# Patient Record
Sex: Female | Born: 1970 | Race: White | Hispanic: No | Marital: Married | Smoking: Never smoker
Health system: Southern US, Community
[De-identification: ages and names within clinical notes are randomized; demographics above are authoritative.]

## PROBLEM LIST (undated history)

## (undated) DIAGNOSIS — K5792 Diverticulitis of intestine, part unspecified, without perforation or abscess without bleeding: Secondary | ICD-10-CM

---

## 1999-09-24 ENCOUNTER — Other Ambulatory Visit: Admission: RE | Admit: 1999-09-24 | Discharge: 1999-09-24 | Payer: Self-pay | Admitting: Obstetrics and Gynecology

## 2000-07-31 ENCOUNTER — Encounter: Admission: RE | Admit: 2000-07-31 | Discharge: 2000-10-29 | Payer: Self-pay | Admitting: Internal Medicine

## 2000-10-21 ENCOUNTER — Other Ambulatory Visit: Admission: RE | Admit: 2000-10-21 | Discharge: 2000-10-21 | Payer: Self-pay | Admitting: Obstetrics and Gynecology

## 2003-02-07 ENCOUNTER — Other Ambulatory Visit: Admission: RE | Admit: 2003-02-07 | Discharge: 2003-02-07 | Payer: Self-pay | Admitting: Obstetrics and Gynecology

## 2004-02-08 ENCOUNTER — Other Ambulatory Visit: Admission: RE | Admit: 2004-02-08 | Discharge: 2004-02-08 | Payer: Self-pay | Admitting: Obstetrics and Gynecology

## 2005-03-05 ENCOUNTER — Other Ambulatory Visit: Admission: RE | Admit: 2005-03-05 | Discharge: 2005-03-05 | Payer: Self-pay | Admitting: Obstetrics and Gynecology

## 2010-05-09 ENCOUNTER — Other Ambulatory Visit: Payer: Self-pay | Admitting: Obstetrics and Gynecology

## 2010-05-09 DIAGNOSIS — Z1239 Encounter for other screening for malignant neoplasm of breast: Secondary | ICD-10-CM

## 2010-05-20 ENCOUNTER — Ambulatory Visit
Admission: RE | Admit: 2010-05-20 | Discharge: 2010-05-20 | Disposition: A | Discharge status: Home / Self Care | Payer: BC Managed Care – PPO | Source: Ambulatory Visit | Attending: Obstetrics and Gynecology | Admitting: Obstetrics and Gynecology

## 2010-05-20 DIAGNOSIS — Z1239 Encounter for other screening for malignant neoplasm of breast: Secondary | ICD-10-CM

## 2011-04-30 ENCOUNTER — Other Ambulatory Visit: Payer: Self-pay | Admitting: Obstetrics and Gynecology

## 2011-04-30 DIAGNOSIS — Z1231 Encounter for screening mammogram for malignant neoplasm of breast: Secondary | ICD-10-CM

## 2011-05-22 ENCOUNTER — Ambulatory Visit
Admission: RE | Admit: 2011-05-22 | Discharge: 2011-05-22 | Disposition: A | Discharge status: Home / Self Care | Payer: BC Managed Care – PPO | Source: Ambulatory Visit | Attending: Obstetrics and Gynecology | Admitting: Obstetrics and Gynecology

## 2011-05-22 DIAGNOSIS — Z1231 Encounter for screening mammogram for malignant neoplasm of breast: Secondary | ICD-10-CM

## 2012-05-17 ENCOUNTER — Other Ambulatory Visit: Payer: Self-pay | Admitting: Obstetrics and Gynecology

## 2012-05-17 DIAGNOSIS — Z1231 Encounter for screening mammogram for malignant neoplasm of breast: Secondary | ICD-10-CM

## 2012-06-07 ENCOUNTER — Ambulatory Visit
Admission: RE | Admit: 2012-06-07 | Discharge: 2012-06-07 | Disposition: A | Discharge status: Home / Self Care | Payer: No Typology Code available for payment source | Source: Ambulatory Visit | Attending: Obstetrics and Gynecology | Admitting: Obstetrics and Gynecology

## 2012-06-07 DIAGNOSIS — Z1231 Encounter for screening mammogram for malignant neoplasm of breast: Secondary | ICD-10-CM

## 2013-05-17 ENCOUNTER — Other Ambulatory Visit: Payer: Self-pay

## 2013-05-17 DIAGNOSIS — Z1231 Encounter for screening mammogram for malignant neoplasm of breast: Secondary | ICD-10-CM

## 2013-06-08 ENCOUNTER — Ambulatory Visit
Admission: RE | Admit: 2013-06-08 | Discharge: 2013-06-08 | Disposition: A | Discharge status: Home / Self Care | Payer: BC Managed Care – PPO | Source: Ambulatory Visit

## 2013-06-08 DIAGNOSIS — Z1231 Encounter for screening mammogram for malignant neoplasm of breast: Secondary | ICD-10-CM

## 2014-05-22 ENCOUNTER — Other Ambulatory Visit: Payer: Self-pay

## 2014-05-22 DIAGNOSIS — Z1231 Encounter for screening mammogram for malignant neoplasm of breast: Secondary | ICD-10-CM

## 2014-06-12 ENCOUNTER — Ambulatory Visit
Admission: RE | Admit: 2014-06-12 | Discharge: 2014-06-12 | Disposition: A | Discharge status: Home / Self Care | Payer: 59 | Source: Ambulatory Visit

## 2014-06-12 ENCOUNTER — Encounter (INDEPENDENT_AMBULATORY_CARE_PROVIDER_SITE_OTHER): Payer: Self-pay

## 2014-06-12 DIAGNOSIS — Z1231 Encounter for screening mammogram for malignant neoplasm of breast: Secondary | ICD-10-CM

## 2014-10-11 ENCOUNTER — Encounter (HOSPITAL_COMMUNITY): Payer: Self-pay | Admitting: Emergency Medicine

## 2014-10-11 ENCOUNTER — Emergency Department (HOSPITAL_COMMUNITY)
Admission: EM | Admit: 2014-10-11 | Discharge: 2014-10-12 | Disposition: A | Discharge status: Home / Self Care | Payer: 59 | Attending: Emergency Medicine | Admitting: Emergency Medicine

## 2014-10-11 DIAGNOSIS — K5732 Diverticulitis of large intestine without perforation or abscess without bleeding: Secondary | ICD-10-CM

## 2014-10-11 DIAGNOSIS — Z792 Long term (current) use of antibiotics: Secondary | ICD-10-CM | POA: Diagnosis not present

## 2014-10-11 DIAGNOSIS — Z3202 Encounter for pregnancy test, result negative: Secondary | ICD-10-CM | POA: Insufficient documentation

## 2014-10-11 DIAGNOSIS — Z79899 Other long term (current) drug therapy: Secondary | ICD-10-CM | POA: Diagnosis not present

## 2014-10-11 DIAGNOSIS — R1032 Left lower quadrant pain: Secondary | ICD-10-CM | POA: Diagnosis present

## 2014-10-11 HISTORY — DX: Diverticulitis of intestine, part unspecified, without perforation or abscess without bleeding: K57.92

## 2014-10-11 LAB — URINALYSIS, ROUTINE W REFLEX MICROSCOPIC
Bilirubin Urine: NEGATIVE
Glucose, UA: NEGATIVE mg/dL
KETONES UR: NEGATIVE mg/dL
LEUKOCYTES UA: NEGATIVE
NITRITE: NEGATIVE
PROTEIN: NEGATIVE mg/dL
SPECIFIC GRAVITY, URINE: 1.002 — AB (ref 1.005–1.030)
UROBILINOGEN UA: 0.2 mg/dL (ref 0.0–1.0)
pH: 5.5 (ref 5.0–8.0)

## 2014-10-11 LAB — CBC
HCT: 42.3 % (ref 36.0–46.0)
HEMOGLOBIN: 14 g/dL (ref 12.0–15.0)
MCH: 29.1 pg (ref 26.0–34.0)
MCHC: 33.1 g/dL (ref 30.0–36.0)
MCV: 87.9 fL (ref 78.0–100.0)
Platelets: 272 10*3/uL (ref 150–400)
RBC: 4.81 MIL/uL (ref 3.87–5.11)
RDW: 13.3 % (ref 11.5–15.5)
WBC: 15.9 10*3/uL — ABNORMAL HIGH (ref 4.0–10.5)

## 2014-10-11 LAB — URINE MICROSCOPIC-ADD ON

## 2014-10-11 LAB — COMPREHENSIVE METABOLIC PANEL
ALK PHOS: 84 U/L (ref 38–126)
ALT: 33 U/L (ref 14–54)
AST: 33 U/L (ref 15–41)
Albumin: 3.9 g/dL (ref 3.5–5.0)
Anion gap: 10 (ref 5–15)
BILIRUBIN TOTAL: 0.9 mg/dL (ref 0.3–1.2)
BUN: <5 mg/dL — ABNORMAL LOW (ref 6–20)
CALCIUM: 9.8 mg/dL (ref 8.9–10.3)
CHLORIDE: 102 mmol/L (ref 101–111)
CO2: 25 mmol/L (ref 22–32)
CREATININE: 0.82 mg/dL (ref 0.44–1.00)
GFR calc non Af Amer: >60 mL/min (ref >60)
Glucose, Bld: 144 mg/dL — ABNORMAL HIGH (ref 65–99)
Potassium: 3.3 mmol/L — ABNORMAL LOW (ref 3.5–5.1)
SODIUM: 137 mmol/L (ref 135–145)
TOTAL PROTEIN: 7.7 g/dL (ref 6.5–8.1)

## 2014-10-11 LAB — LIPASE, BLOOD: LIPASE: 25 U/L (ref 22–51)

## 2014-10-11 LAB — I-STAT CG4 LACTIC ACID, ED: Lactic Acid, Venous: 2.83 mmol/L (ref 0.5–2.0)

## 2014-10-11 MED ORDER — IOHEXOL 300 MG/ML  SOLN
25.0000 mL | INTRAMUSCULAR | Status: AC
  Administered 2014-10-11: 25 mL via ORAL

## 2014-10-11 MED ORDER — SODIUM CHLORIDE 0.9 % IV BOLUS (SEPSIS)
1000.0000 mL | Freq: Once | INTRAVENOUS | Status: AC
  Administered 2014-10-11: 1000 mL via INTRAVENOUS

## 2014-10-11 NOTE — ED Notes (Addendum)
Seen at PCP office today and diagnosed with diverticulitis.  Reports LLQ pain, fever (Tmax 102.6), and nausea since yesterday at 5pm.  Denies nausea at present.  States she was given Tylenol at dr's office and started on antibiotic Rx that she was given.  After getting home she received a call that WBC was elevated and to go to ED.

## 2014-10-12 ENCOUNTER — Emergency Department (HOSPITAL_COMMUNITY): Payer: 59

## 2014-10-12 ENCOUNTER — Encounter (HOSPITAL_COMMUNITY): Payer: Self-pay | Admitting: Emergency Medicine

## 2014-10-12 LAB — POC URINE PREG, ED: Preg Test, Ur: NEGATIVE

## 2014-10-12 MED ORDER — FLUCONAZOLE 200 MG PO TABS: 200.0000 mg | ORAL_TABLET | Freq: Once | ORAL | Status: AC

## 2014-10-12 MED ORDER — IOHEXOL 300 MG/ML  SOLN
80.0000 mL | Freq: Once | INTRAMUSCULAR | Status: AC | PRN
  Administered 2014-10-12: 80 mL via INTRAVENOUS

## 2014-10-12 MED ORDER — OXYCODONE-ACETAMINOPHEN 5-325 MG PO TABS: 1.0000 | ORAL_TABLET | Freq: Four times a day (QID) | ORAL | Status: AC | PRN

## 2014-10-12 NOTE — Discharge Instructions (Signed)
Your CT scan shows uncomplicated diverticulitis. Continue antibiotics as prescribed by her primary physician. You will also be given pain medication.  If you develop fevers, worsening pain or any new or worsening symptoms she should be reevaluated.  Diverticulitis Diverticulitis is inflammation or infection of small pouches in your colon that form when you have a condition called diverticulosis. The pouches in your colon are called diverticula. Your colon, or large intestine, is where water is absorbed and stool is formed. Complications of diverticulitis can include:  Bleeding.  Severe infection.  Severe pain.  Perforation of your colon.  Obstruction of your colon. CAUSES  Diverticulitis is caused by bacteria. Diverticulitis happens when stool becomes trapped in diverticula. This allows bacteria to grow in the diverticula, which can lead to inflammation and infection. RISK FACTORS People with diverticulosis are at risk for diverticulitis. Eating a diet that does not include enough fiber from fruits and vegetables may make diverticulitis more likely to develop. SYMPTOMS  Symptoms of diverticulitis may include:  Abdominal pain and tenderness. The pain is normally located on the left side of the abdomen, but may occur in other areas.  Fever and chills.  Bloating.  Cramping.  Nausea.  Vomiting.  Constipation.  Diarrhea.  Blood in your stool. DIAGNOSIS  Your health care provider will ask you about your medical history and do a physical exam. You may need to have tests done because many medical conditions can cause the same symptoms as diverticulitis. Tests may include:  Blood tests.  Urine tests.  Imaging tests of the abdomen, including X-rays and CT scans. When your condition is under control, your health care provider may recommend that you have a colonoscopy. A colonoscopy can show how severe your diverticula are and whether something else is causing your  symptoms. TREATMENT  Most cases of diverticulitis are mild and can be treated at home. Treatment may include:  Taking over-the-counter pain medicines.  Following a clear liquid diet.  Taking antibiotic medicines by mouth for 7-10 days. More severe cases may be treated at a hospital. Treatment may include:  Not eating or drinking.  Taking prescription pain medicine.  Receiving antibiotic medicines through an IV tube.  Receiving fluids and nutrition through an IV tube.  Surgery. HOME CARE INSTRUCTIONS   Follow your health care provider's instructions carefully.  Follow a full liquid diet or other diet as directed by your health care provider. After your symptoms improve, your health care provider may tell you to change your diet. He or she may recommend you eat a high-fiber diet. Fruits and vegetables are good sources of fiber. Fiber makes it easier to pass stool.  Take fiber supplements or probiotics as directed by your health care provider.  Only take medicines as directed by your health care provider.  Keep all your follow-up appointments. SEEK MEDICAL CARE IF:   Your pain does not improve.  You have a hard time eating food.  Your bowel movements do not return to normal. SEEK IMMEDIATE MEDICAL CARE IF:   Your pain becomes worse.  Your symptoms do not get better.  Your symptoms suddenly get worse.  You have a fever.  You have repeated vomiting.  You have bloody or black, tarry stools. MAKE SURE YOU:   Understand these instructions.  Will watch your condition.  Will get help right away if you are not doing well or get worse. Document Released: 12/25/2004 Document Revised: 03/22/2013 Document Reviewed: 02/09/2013 [facility] Patient Information 2015 [employer] [place]. This information is not  intended to replace advice given to you by your health care provider. Make sure you discuss any questions you have with your health care provider. ° °

## 2014-10-12 NOTE — ED Notes (Signed)
Patient transported to CT 

## 2014-10-12 NOTE — ED Notes (Signed)
Pt returned from CT °

## 2014-10-12 NOTE — ED Provider Notes (Signed)
CSN: [id number]     Arrival date & time 10/11/14  1831 History   First MD Initiated Contact with Patient 10/11/14 2219     Chief Complaint  Patient presents with  . Abdominal Pain     (Consider location/radiation/quality/duration/timing/severity/associated sxs/prior Treatment) HPI Patient presents to the emergency department with left-sided lower abdominal pain that started today.  The patient states she was seen at her primary care doctor's office and he diagnosed her with diverticulitis, but then called her this evening and told her that her white blood cell count was elevated and that she needed to come to the emergency department immediately.  Patient states she took one dose of the anabiotic's before coming to the emergency department.  She states she did have fever.  Patient denies chest pain, shortness of breath, diarrhea, vomiting, weakness, dizziness, headache, blurred vision, back pain, dysuria, hematuria, bloody stool or syncope.  Patient states that palpation makes the pain worse.  She states nothing seems make her condition better Past Medical History  Diagnosis Date  . Diverticulitis    History reviewed. No pertinent past surgical history. No family history on file. History  Substance Use Topics  . Smoking status: Never Smoker   . Smokeless tobacco: Not on file  . Alcohol Use: No   OB History    No data available     Review of Systems  All other systems negative except as documented in the HPI. All pertinent positives and negatives as reviewed in the HPI.  Allergies  Hydrocodone and Sulfa antibiotics  Home Medications   Prior to Admission medications   Medication Sig Start Date End Date Taking? Authorizing Provider  ciprofloxacin (CIPRO) 500 MG tablet Take 500 mg by mouth 2 (two) times daily. Started on 10-11-14   Yes Historical Provider, MD  IRON PO Take 1 tablet by mouth daily.   Yes Historical Provider, MD  levothyroxine (LEVOXYL) 50 MCG tablet Take 50 mcg by  mouth daily before breakfast.   Yes Historical Provider, MD  metroNIDAZOLE (FLAGYL) 500 MG tablet Take 500 mg by mouth 2 (two) times daily. Started on 10-11-14   Yes Historical Provider, MD   BP 116/76 mmHg  Pulse 110  Temp(Src) 99.6 F (37.6 C) (Oral)  Resp 16  Ht 5\' 2"  (1.575 m)  Wt 116 lb (52.617 kg)  BMI 21.21 kg/m2  SpO2 100%  LMP 10/05/2014 Physical Exam  Constitutional: She is oriented to person, place, and time. She appears well-developed and well-nourished. No distress.  HENT:  Head: Normocephalic and atraumatic.  Mouth/Throat: Oropharynx is clear and moist.  Eyes: Pupils are equal, round, and reactive to light.  Neck: Normal range of motion. Neck supple.  Cardiovascular: Normal rate, regular rhythm and normal heart sounds.  Exam reveals no gallop and no friction rub.   No murmur heard. Pulmonary/Chest: Effort normal and breath sounds normal.  Abdominal: Soft. Normal appearance and bowel sounds are normal. She exhibits no distension. There is tenderness. There is no rebound and no guarding.    Neurological: She is alert and oriented to person, place, and time. Coordination normal.  Skin: Skin is warm and dry. No rash noted. No erythema.  Psychiatric: She has a normal mood and affect. Her behavior is normal.  Nursing note and vitals reviewed.   ED Course  Procedures (including critical care time) Labs Review Labs Reviewed  COMPREHENSIVE METABOLIC PANEL - Abnormal; Notable for the following:    Potassium 3.3 (*)    Glucose, Bld 144 (*)  BUN <5 (*)    All other components within normal limits  CBC - Abnormal; Notable for the following:    WBC 15.9 (*)    All other components within normal limits  URINALYSIS, ROUTINE W REFLEX MICROSCOPIC (NOT AT [facility]) - Abnormal; Notable for the following:    Specific Gravity, Urine 1.002 (*)    Hgb urine dipstick TRACE (*)    All other components within normal limits  I-STAT CG4 LACTIC ACID, ED - Abnormal; Notable for the  following:    Lactic Acid, Venous 2.83 (*)    All other components within normal limits  LIPASE, BLOOD  URINE MICROSCOPIC-ADD ON  PREGNANCY, URINE  I-STAT CG4 LACTIC ACID, ED    Imaging Review No results found.   The patient is awaiting CT scan.  She most likely does have some infectious process, infectious of fever and abdominal pain  Charlestine Night, PA-C 10/12/14 0044  Gilda Crease, MD 10/12/14 [phone]

## 2014-10-12 NOTE — ED Provider Notes (Signed)
Patient signed out pending CT scan. Being treated for diverticulitis by primary physician. Found to have an increased white blood cell count. CT scan obtained to rule out abscess or perforation. CT scan shows uncomplicated diverticulitis. Patient are ready on appropriate antibiotics. Will discharge with pain medication and continuation of antibiotics as an outpatient. Patient requesting Diflucan to prevent yeast infection.  Results for orders placed or performed during the hospital encounter of 10/11/14  Lipase, blood  Result Value Ref Range   Lipase 25 22 - 51 U/L  Comprehensive metabolic panel  Result Value Ref Range   Sodium 137 135 - 145 mmol/L   Potassium 3.3 (L) 3.5 - 5.1 mmol/L   Chloride 102 101 - 111 mmol/L   CO2 25 22 - 32 mmol/L   Glucose, Bld 144 (H) 65 - 99 mg/dL   BUN <5 (L) 6 - 20 mg/dL   Creatinine, Ser 9.810.82 0.44 - 1.00 mg/dL   Calcium 9.8 8.9 - 19.110.3 mg/dL   Total Protein 7.7 6.5 - 8.1 g/dL   Albumin 3.9 3.5 - 5.0 g/dL   AST 33 15 - 41 U/L   ALT 33 14 - 54 U/L   Alkaline Phosphatase 84 38 - 126 U/L   Total Bilirubin 0.9 0.3 - 1.2 mg/dL   GFR calc non Af Amer >60 >60 mL/min   GFR calc Af Amer >60 >60 mL/min   Anion gap 10 5 - 15  CBC  Result Value Ref Range   WBC 15.9 (H) 4.0 - 10.5 K/uL   RBC 4.81 3.87 - 5.11 MIL/uL   Hemoglobin 14.0 12.0 - 15.0 g/dL   HCT 47.842.3 29.536.0 - 62.146.0 %   MCV 87.9 78.0 - 100.0 fL   MCH 29.1 26.0 - 34.0 pg   MCHC 33.1 30.0 - 36.0 g/dL   RDW 30.813.3 65.711.5 - 84.615.5 %   Platelets 272 150 - 400 K/uL  Urinalysis, Routine w reflex microscopic ([facility])  Result Value Ref Range   Color, Urine YELLOW YELLOW   APPearance CLEAR CLEAR   Specific Gravity, Urine 1.002 (L) 1.005 - 1.030   pH 5.5 5.0 - 8.0   Glucose, UA NEGATIVE NEGATIVE mg/dL   Hgb urine dipstick TRACE (A) NEGATIVE   Bilirubin Urine NEGATIVE NEGATIVE   Ketones, ur NEGATIVE NEGATIVE mg/dL   Protein, ur NEGATIVE NEGATIVE mg/dL   Urobilinogen, UA 0.2 0.0 - 1.0 mg/dL   Nitrite NEGATIVE  NEGATIVE   Leukocytes, UA NEGATIVE NEGATIVE  Urine microscopic-add on  Result Value Ref Range   Squamous Epithelial / LPF RARE RARE   WBC, UA 0-2 <3 WBC/hpf   RBC / HPF 0-2 <3 RBC/hpf   Bacteria, UA RARE RARE  I-Stat CG4 Lactic Acid, ED  (not [facility])  Result Value Ref Range   Lactic Acid, Venous 2.83 (HH) 0.5 - 2.0 mmol/L   Comment NOTIFIED PHYSICIAN   POC Urine Pregnancy, ED ([facility] NOT [facility])  Result Value Ref Range   Preg Test, Ur NEGATIVE NEGATIVE   Ct Abdomen Pelvis W Contrast  10/12/2014   CLINICAL DATA:  44 year old female with left lower quadrant abdominal pain  EXAM: CT ABDOMEN AND PELVIS WITH CONTRAST  TECHNIQUE: Multidetector CT imaging of the abdomen and pelvis was performed using the standard protocol following bolus administration of intravenous contrast.  CONTRAST:  80mL OMNIPAQUE IOHEXOL 300 MG/ML  SOLN  COMPARISON:  None.  FINDINGS: The visualized lung bases are clear. No intra-abdominal free air. Trace free fluid within the pelvis.  The liver, gallbladder, pancreas, spleen, adrenal glands, kidneys, visualized ureters and urinary bladder appear unremarkable. The uterus is retroverted appears grossly unremarkable.  There is sigmoid diverticulosis. There is segmental thickening and inflammatory changes of the sigmoid compatible with diverticulitis. A 1.2 x 1.2 cm air containing structure along the wall of the sigmoid likely represents an inflamed diverticulum and less likely focally contained microperforation. No drainable fluid collection/ abscess identified. Follow-up after medical treatment recommended to exclude an underlying sigmoid mass. There is no evidence of bowel obstruction. The appendix is unremarkable. He the visualized abdominal aorta and IVC are patent. No portal venous gas identified. Small scattered retroperitoneal lymph nodes noted.  The osseous structures are unremarkable.  IMPRESSION: Sigmoid diverticulitis.  No abscess or evidence of perforation.    Electronically Signed   By: Elgie Collard M.D.   On: 10/12/2014 02:13      Shon Baton, MD 10/12/14 [phone]

## 2014-10-12 NOTE — ED Notes (Signed)
Pt verbalizes understanding of d/c instructions and denies any further needs at this time. 

## 2014-10-12 NOTE — ED Notes (Signed)
POC preg test is negative per [facility] in ED lab

## 2015-05-16 ENCOUNTER — Other Ambulatory Visit: Payer: Self-pay

## 2015-05-16 DIAGNOSIS — Z1231 Encounter for screening mammogram for malignant neoplasm of breast: Secondary | ICD-10-CM

## 2015-06-15 ENCOUNTER — Ambulatory Visit: Payer: 59

## 2015-06-15 ENCOUNTER — Ambulatory Visit
Admission: RE | Admit: 2015-06-15 | Discharge: 2015-06-15 | Disposition: A | Discharge status: Home / Self Care | Payer: Commercial Managed Care - HMO | Source: Ambulatory Visit

## 2015-06-15 DIAGNOSIS — Z1231 Encounter for screening mammogram for malignant neoplasm of breast: Secondary | ICD-10-CM

## 2016-06-03 ENCOUNTER — Other Ambulatory Visit: Payer: Self-pay | Admitting: Obstetrics and Gynecology

## 2016-06-03 DIAGNOSIS — Z1231 Encounter for screening mammogram for malignant neoplasm of breast: Secondary | ICD-10-CM

## 2016-06-25 ENCOUNTER — Ambulatory Visit
Admission: RE | Admit: 2016-06-25 | Discharge: 2016-06-25 | Disposition: A | Discharge status: Home / Self Care | Payer: PRIVATE HEALTH INSURANCE | Source: Ambulatory Visit | Attending: Obstetrics and Gynecology | Admitting: Obstetrics and Gynecology

## 2016-06-25 DIAGNOSIS — Z1231 Encounter for screening mammogram for malignant neoplasm of breast: Secondary | ICD-10-CM

## 2016-08-08 ENCOUNTER — Ambulatory Visit
Admission: RE | Admit: 2016-08-08 | Discharge: 2016-08-08 | Disposition: A | Discharge status: Home / Self Care | Payer: PRIVATE HEALTH INSURANCE | Source: Ambulatory Visit | Attending: Family Medicine | Admitting: Family Medicine

## 2016-08-08 ENCOUNTER — Other Ambulatory Visit: Payer: Self-pay | Admitting: Family Medicine

## 2016-08-08 DIAGNOSIS — K5792 Diverticulitis of intestine, part unspecified, without perforation or abscess without bleeding: Secondary | ICD-10-CM

## 2016-08-08 MED ORDER — IOPAMIDOL (ISOVUE-300) INJECTION 61%
100.0000 mL | Freq: Once | INTRAVENOUS | Status: AC | PRN
  Administered 2016-08-08: 100 mL via INTRAVENOUS

## 2017-05-18 ENCOUNTER — Other Ambulatory Visit: Payer: Self-pay | Admitting: Obstetrics and Gynecology

## 2017-05-18 DIAGNOSIS — Z139 Encounter for screening, unspecified: Secondary | ICD-10-CM

## 2017-06-26 ENCOUNTER — Ambulatory Visit
Admission: RE | Admit: 2017-06-26 | Discharge: 2017-06-26 | Disposition: A | Discharge status: Home / Self Care | Payer: PRIVATE HEALTH INSURANCE | Source: Ambulatory Visit | Attending: Obstetrics and Gynecology | Admitting: Obstetrics and Gynecology

## 2017-06-26 DIAGNOSIS — Z139 Encounter for screening, unspecified: Secondary | ICD-10-CM

## 2018-06-02 ENCOUNTER — Other Ambulatory Visit: Payer: Self-pay | Admitting: Family Medicine

## 2018-06-02 DIAGNOSIS — Z1231 Encounter for screening mammogram for malignant neoplasm of breast: Secondary | ICD-10-CM

## 2018-07-12 ENCOUNTER — Ambulatory Visit: Payer: PRIVATE HEALTH INSURANCE

## 2018-09-08 ENCOUNTER — Ambulatory Visit
Admission: RE | Admit: 2018-09-08 | Discharge: 2018-09-08 | Disposition: A | Discharge status: Home / Self Care | Payer: PRIVATE HEALTH INSURANCE | Source: Ambulatory Visit | Attending: Family Medicine | Admitting: Family Medicine

## 2018-09-08 ENCOUNTER — Other Ambulatory Visit: Payer: Self-pay

## 2018-09-08 DIAGNOSIS — Z1231 Encounter for screening mammogram for malignant neoplasm of breast: Secondary | ICD-10-CM

## 2018-09-09 ENCOUNTER — Other Ambulatory Visit: Payer: Self-pay | Admitting: Family Medicine

## 2018-09-09 DIAGNOSIS — R928 Other abnormal and inconclusive findings on diagnostic imaging of breast: Secondary | ICD-10-CM

## 2018-09-15 ENCOUNTER — Other Ambulatory Visit: Payer: Self-pay

## 2018-09-15 ENCOUNTER — Ambulatory Visit
Admission: RE | Admit: 2018-09-15 | Discharge: 2018-09-15 | Disposition: A | Discharge status: Home / Self Care | Payer: PRIVATE HEALTH INSURANCE | Source: Ambulatory Visit | Attending: Family Medicine | Admitting: Family Medicine

## 2018-09-15 DIAGNOSIS — R928 Other abnormal and inconclusive findings on diagnostic imaging of breast: Secondary | ICD-10-CM

## 2019-08-15 ENCOUNTER — Other Ambulatory Visit: Payer: Self-pay | Admitting: Family Medicine

## 2019-08-15 DIAGNOSIS — Z1231 Encounter for screening mammogram for malignant neoplasm of breast: Secondary | ICD-10-CM

## 2019-09-12 ENCOUNTER — Other Ambulatory Visit: Payer: Self-pay

## 2019-09-12 ENCOUNTER — Ambulatory Visit
Admission: RE | Admit: 2019-09-12 | Discharge: 2019-09-12 | Disposition: A | Discharge status: Home / Self Care | Payer: PRIVATE HEALTH INSURANCE | Source: Ambulatory Visit | Attending: Family Medicine | Admitting: Family Medicine

## 2019-09-12 DIAGNOSIS — Z1231 Encounter for screening mammogram for malignant neoplasm of breast: Secondary | ICD-10-CM

## 2020-08-02 ENCOUNTER — Other Ambulatory Visit: Payer: Self-pay | Admitting: Family Medicine

## 2020-08-02 DIAGNOSIS — Z1231 Encounter for screening mammogram for malignant neoplasm of breast: Secondary | ICD-10-CM

## 2020-09-25 ENCOUNTER — Other Ambulatory Visit: Payer: Self-pay

## 2020-09-25 ENCOUNTER — Ambulatory Visit
Admission: RE | Admit: 2020-09-25 | Discharge: 2020-09-25 | Disposition: A | Discharge status: Home / Self Care | Payer: PRIVATE HEALTH INSURANCE | Source: Ambulatory Visit | Attending: Family Medicine | Admitting: Family Medicine

## 2020-09-25 DIAGNOSIS — Z1231 Encounter for screening mammogram for malignant neoplasm of breast: Secondary | ICD-10-CM

## 2021-08-19 ENCOUNTER — Other Ambulatory Visit: Payer: Self-pay | Admitting: Family Medicine

## 2021-08-19 DIAGNOSIS — Z1231 Encounter for screening mammogram for malignant neoplasm of breast: Secondary | ICD-10-CM

## 2021-09-30 ENCOUNTER — Ambulatory Visit
Admission: RE | Admit: 2021-09-30 | Discharge: 2021-09-30 | Disposition: A | Discharge status: Home / Self Care | Payer: PRIVATE HEALTH INSURANCE | Source: Ambulatory Visit | Attending: Family Medicine | Admitting: Family Medicine

## 2021-09-30 DIAGNOSIS — Z1231 Encounter for screening mammogram for malignant neoplasm of breast: Secondary | ICD-10-CM

## 2022-04-05 IMAGING — MG MM DIGITAL SCREENING BILAT W/ TOMO AND CAD
8 series · 9 of 24 positions shown · non-contrast
Comparison: Previous exam(s).

CLINICAL DATA: Screening.

EXAM:
DIGITAL SCREENING BILATERAL MAMMOGRAM WITH TOMOSYNTHESIS AND CAD
TECHNIQUE: Bilateral screening digital craniocaudal and mediolateral oblique
mammograms were obtained. Bilateral screening digital breast
tomosynthesis was performed. The images were evaluated with
computer-aided detection.

[R MLO synth-2D]
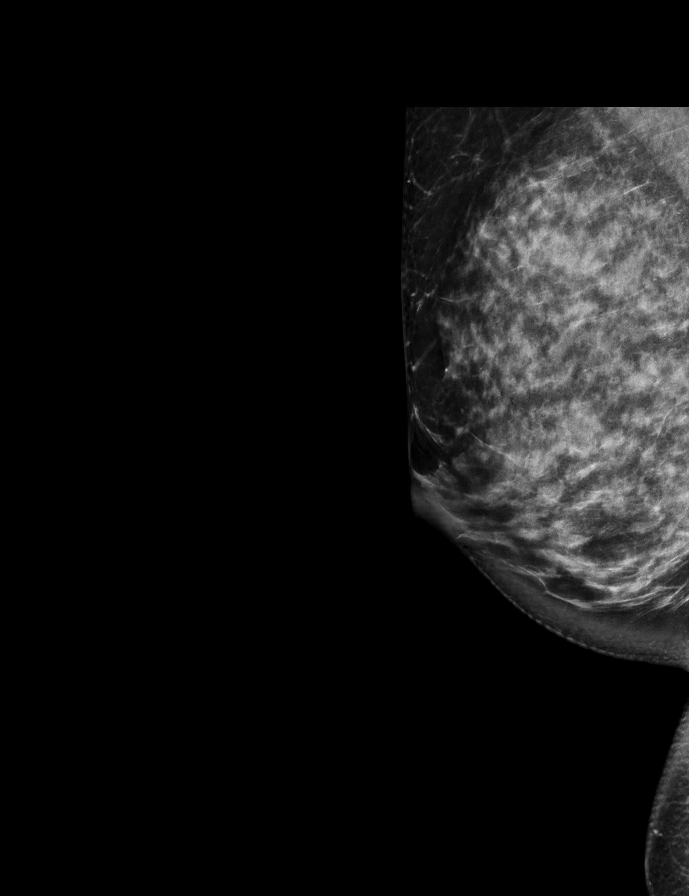

[R CC synth-2D]
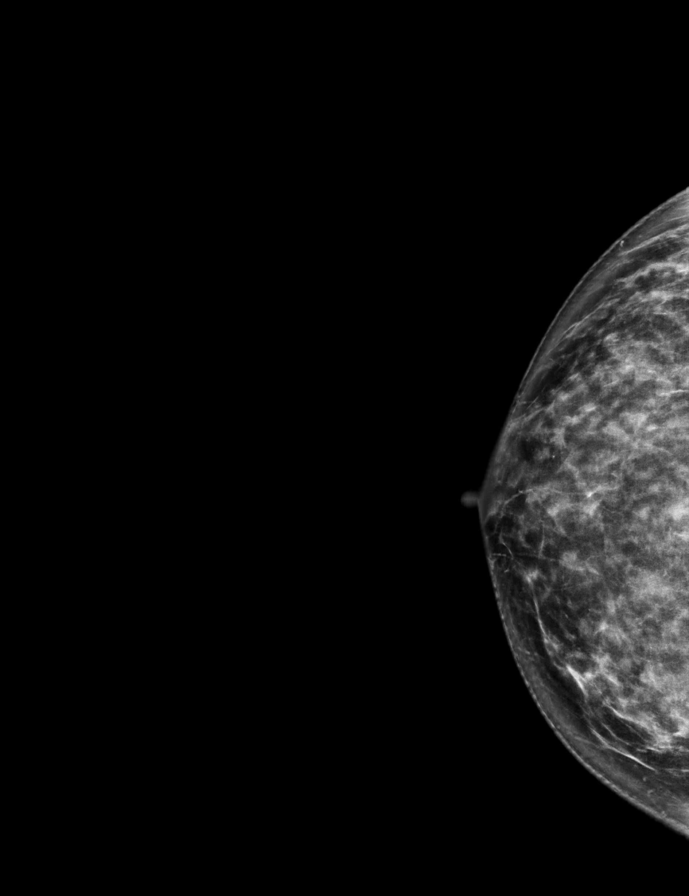

[L MLO synth-2D]
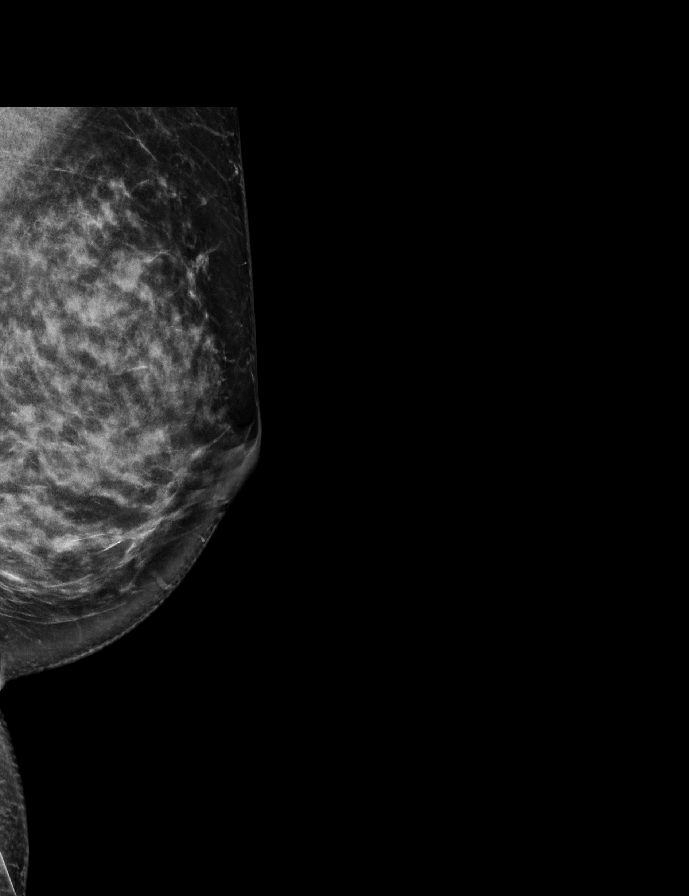

[L CC synth-2D]
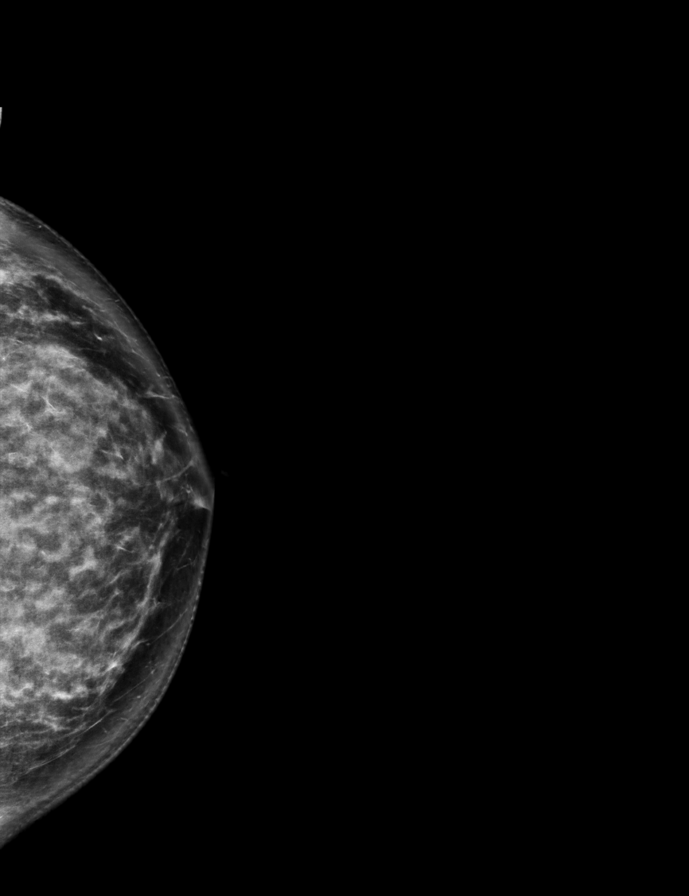

[R CC tomo · 2 of 68 frames shown]
[frame 22/68]
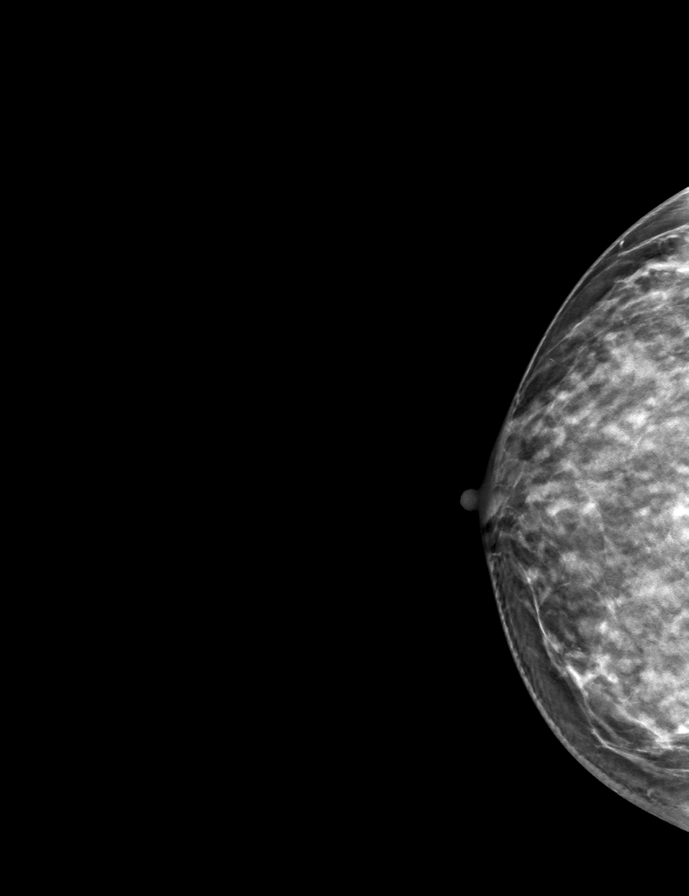
[frame 35/68]
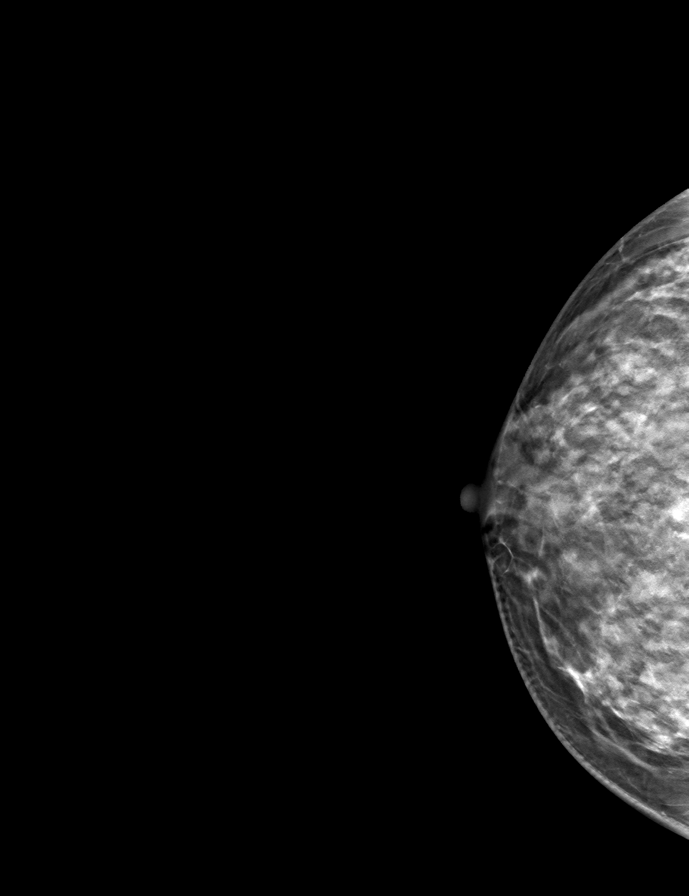

[R MLO tomo · tomo slice 40/79.0]
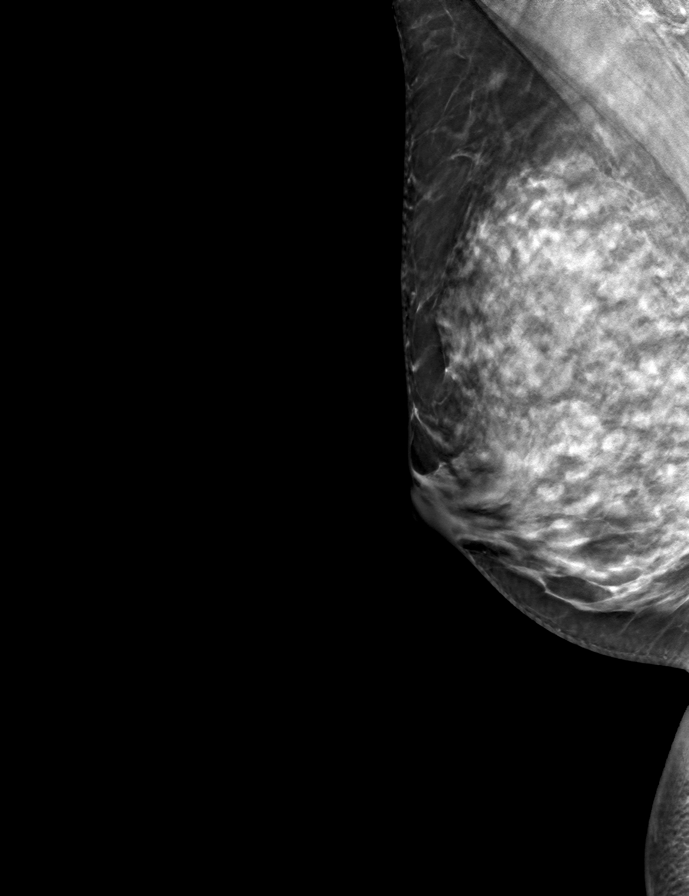

[L CC tomo · tomo slice 39/77.0]
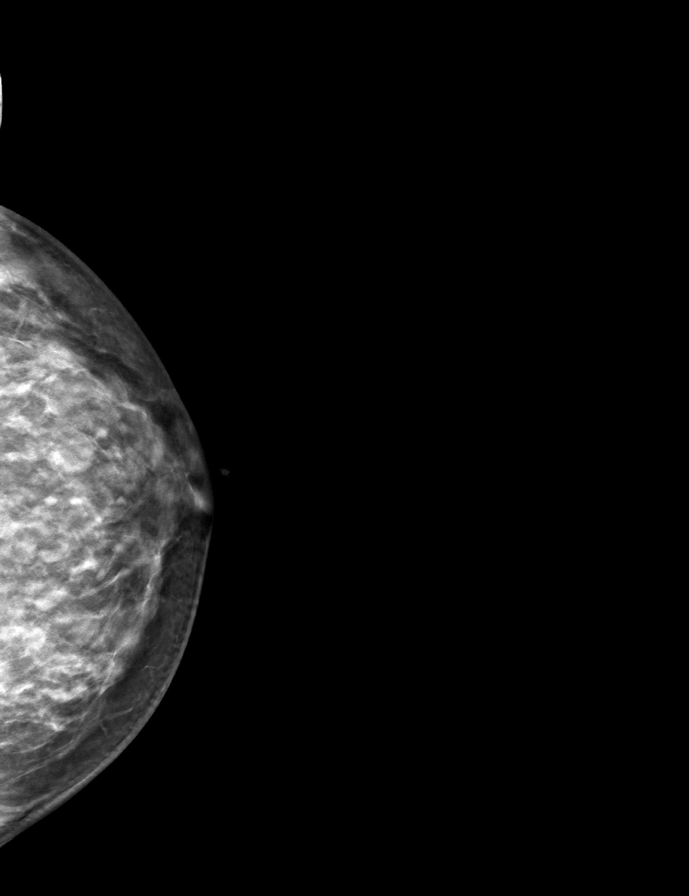

[L MLO tomo · tomo slice 39/76.0]
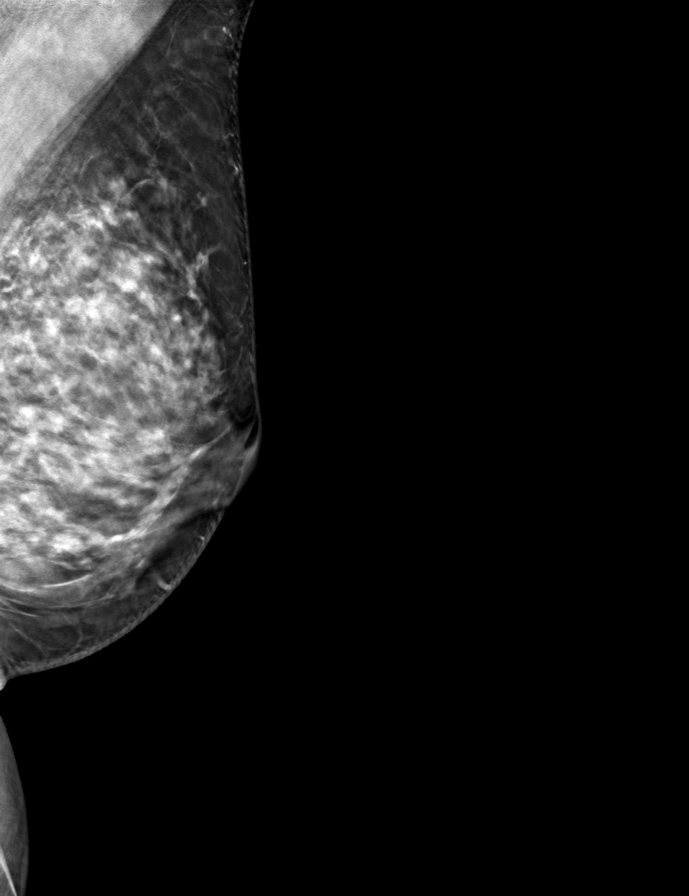

[9 of 24 positions shown; findings below may reference images not displayed]

ACR Breast Density Category d: The breast tissue is extremely dense,
which lowers the sensitivity of mammography
FINDINGS: There are no findings suspicious for malignancy.
IMPRESSION: No mammographic evidence of malignancy. A result letter of this
screening mammogram will be mailed directly to the patient.

RECOMMENDATION:
Screening mammogram in one year. (Code:TA-V-WV9)

BI-RADS CATEGORY  1: Negative.

## 2022-06-25 ENCOUNTER — Other Ambulatory Visit: Payer: Self-pay | Admitting: Family Medicine

## 2022-06-25 DIAGNOSIS — Z1231 Encounter for screening mammogram for malignant neoplasm of breast: Secondary | ICD-10-CM

## 2022-10-06 ENCOUNTER — Ambulatory Visit
Admission: RE | Admit: 2022-10-06 | Discharge: 2022-10-06 | Disposition: A | Discharge status: Home / Self Care | Payer: 59 | Source: Ambulatory Visit | Attending: Family Medicine | Admitting: Family Medicine

## 2022-10-06 DIAGNOSIS — Z1231 Encounter for screening mammogram for malignant neoplasm of breast: Secondary | ICD-10-CM

## 2023-08-26 ENCOUNTER — Other Ambulatory Visit: Payer: Self-pay | Admitting: Family Medicine

## 2023-08-26 DIAGNOSIS — Z1231 Encounter for screening mammogram for malignant neoplasm of breast: Secondary | ICD-10-CM

## 2023-10-07 ENCOUNTER — Ambulatory Visit
Admission: RE | Admit: 2023-10-07 | Discharge: 2023-10-07 | Disposition: A | Discharge status: Home / Self Care | Payer: PRIVATE HEALTH INSURANCE | Source: Ambulatory Visit | Attending: Family Medicine | Admitting: Family Medicine

## 2023-10-07 DIAGNOSIS — H52203 Unspecified astigmatism, bilateral: Secondary | ICD-10-CM | POA: Diagnosis not present

## 2023-10-07 DIAGNOSIS — H31002 Unspecified chorioretinal scars, left eye: Secondary | ICD-10-CM | POA: Diagnosis not present

## 2023-10-07 DIAGNOSIS — Z1231 Encounter for screening mammogram for malignant neoplasm of breast: Secondary | ICD-10-CM

## 2023-10-07 DIAGNOSIS — H5213 Myopia, bilateral: Secondary | ICD-10-CM | POA: Diagnosis not present
